# Patient Record
Sex: Female | Born: 1955 | Race: Black or African American | Hispanic: No | Smoking: Never smoker
Health system: Southern US, Community
[De-identification: ages and names within clinical notes are randomized; demographics above are authoritative.]

## PROBLEM LIST (undated history)

## (undated) DIAGNOSIS — I10 Essential (primary) hypertension: Secondary | ICD-10-CM

---

## 2005-09-14 ENCOUNTER — Emergency Department (HOSPITAL_COMMUNITY): Admission: EM | Admit: 2005-09-14 | Discharge: 2005-09-14 | Payer: Self-pay | Admitting: Emergency Medicine
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
## 2011-03-05 ENCOUNTER — Encounter: Payer: Self-pay | Admitting: *Deleted

## 2011-03-05 ENCOUNTER — Emergency Department (HOSPITAL_BASED_OUTPATIENT_CLINIC_OR_DEPARTMENT_OTHER)
Admission: EM | Admit: 2011-03-05 | Discharge: 2011-03-05 | Disposition: A | Discharge status: Home / Self Care | Payer: PRIVATE HEALTH INSURANCE | Attending: Emergency Medicine | Admitting: Emergency Medicine

## 2011-03-05 DIAGNOSIS — I10 Essential (primary) hypertension: Secondary | ICD-10-CM

## 2011-03-05 LAB — DIFFERENTIAL
Basophils Absolute: 0 10*3/uL (ref 0.0–0.1)
Basophils Relative: 0 % (ref 0–1)
Eosinophils Absolute: 0.1 10*3/uL (ref 0.0–0.7)
Eosinophils Relative: 1 % (ref 0–5)
Monocytes Absolute: 0.7 10*3/uL (ref 0.1–1.0)
Monocytes Relative: 10 % (ref 3–12)
Neutrophils Relative %: 49 % (ref 43–77)

## 2011-03-05 LAB — BASIC METABOLIC PANEL
BUN: 16 mg/dL (ref 6–23)
CO2: 28 mEq/L (ref 19–32)
Calcium: 9.7 mg/dL (ref 8.4–10.5)
Chloride: 101 mEq/L (ref 96–112)
Creatinine, Ser: 0.8 mg/dL (ref 0.50–1.10)
GFR calc non Af Amer: 81 mL/min — ABNORMAL LOW (ref >90)
Glucose, Bld: 105 mg/dL — ABNORMAL HIGH (ref 70–99)
Potassium: 4.2 mEq/L (ref 3.5–5.1)

## 2011-03-05 LAB — CBC
Platelets: 272 10*3/uL (ref 150–400)
RBC: 4.66 MIL/uL (ref 3.87–5.11)
WBC: 7.2 10*3/uL (ref 4.0–10.5)

## 2011-03-05 MED ORDER — HYDROCHLOROTHIAZIDE 25 MG PO TABS: 25.0000 mg | ORAL_TABLET | Freq: Every day | ORAL | Status: AC

## 2011-03-05 MED ORDER — HYDROCHLOROTHIAZIDE 25 MG PO TABS
25.0000 mg | ORAL_TABLET | Freq: Once | ORAL | Status: AC
  Administered 2011-03-05: 25 mg via ORAL
  Filled 2011-03-05: qty 1

## 2011-03-05 NOTE — ED Notes (Signed)
Pt concerned about her blood pressure going up and down states is about to have surgery on knee "the specialist wants to know how its running so I come out here to see"

## 2011-03-05 NOTE — ED Provider Notes (Signed)
History     CSN: [id number] Arrival date & time: 03/05/2011  7:12 PM  Chief Complaint  Patient presents with  . Hypertension    (Consider location/radiation/quality/duration/timing/severity/associated sxs/prior treatment) HPI Comments: The patient presents for evaluation of her blood pressure. She reports that her blood pressure has been running high at home for the past week. She denies diabetes but says she wants to be checked for this. She has not been formally diagnosed her started on treatment for hypertension to this point. She denies any symptoms including headache, dizziness, visual changes, chest pain, shortness of breath, palpitations, nausea or vomiting. She is asymptomatic at present with a blood pressure of 178/76 mmHg. I will obtain a CBC and a basic metabolic profile to take a look at her electrolytes and kidney function. In the meantime I have ordered 25 mg of hydrochlorothiazide for treatment of her blood pressure and will likely prescribe her the same given that she has had approximately a week of elevated blood pressures at this point. Her blood pressures not acutely worrisome and she has no symptoms with it.  Patient is a 55 y.o. female presenting with hypertension. The history is provided by the patient.  Hypertension This is a new problem. The current episode started more than 1 week ago. The problem occurs constantly. The problem has not changed since onset.Pertinent negatives include no chest pain, no abdominal pain, no headaches and no shortness of breath. The symptoms are aggravated by nothing. The symptoms are relieved by nothing. She has tried nothing for the symptoms.    History reviewed. No pertinent past medical history.  History reviewed. No pertinent past surgical history.  History reviewed. No pertinent family history.  History  Substance Use Topics  . Smoking status: Never Smoker   . Smokeless tobacco: Not on file  . Alcohol Use: No    OB History    Grav Para Term Preterm Abortions TAB SAB Ect Mult Living                  Review of Systems  Constitutional: Negative for fever, chills and appetite change.  HENT: Negative for ear pain, congestion, sore throat, rhinorrhea, drooling, trouble swallowing, neck pain, neck stiffness, voice change and postnasal drip.   Eyes: Negative for photophobia and visual disturbance.  Respiratory: Negative for cough, chest tightness, shortness of breath and wheezing.   Cardiovascular: Negative for chest pain and palpitations.  Gastrointestinal: Negative for nausea, vomiting, abdominal pain, diarrhea, constipation and abdominal distention.  Genitourinary: Negative.   Musculoskeletal: Negative for myalgias, back pain, joint swelling, arthralgias and gait problem.  Skin: Negative for color change, pallor, rash and wound.  Neurological: Negative for tremors, seizures, syncope, facial asymmetry, speech difficulty, weakness, light-headedness, numbness and headaches.    Allergies  Review of patient's allergies indicates no known allergies.  Home Medications  No current outpatient prescriptions on file.  BP 178/76  Pulse 82  Temp(Src) 98.7 F (37.1 C) (Oral)  Resp 20  SpO2 100%  Physical Exam  Nursing note and vitals reviewed. Constitutional: She is oriented to person, place, and time. She appears well-developed and well-nourished. No distress.  HENT:  Head: Normocephalic and atraumatic.  Mouth/Throat: Oropharynx is clear and moist.  Eyes: Conjunctivae and EOM are normal.  Neck: Normal range of motion.  Cardiovascular: Normal rate, regular rhythm, normal heart sounds and intact distal pulses.  Exam reveals no gallop and no friction rub.   No murmur heard. Pulmonary/Chest: Effort normal and breath sounds normal. No respiratory  distress. She has no wheezes. She has no rales. She exhibits no tenderness.  Abdominal: Soft. Bowel sounds are normal. She exhibits no distension. There is no tenderness.  There is no rebound and no guarding.  Musculoskeletal: Normal range of motion. She exhibits no edema and no tenderness.  Neurological: She is alert and oriented to person, place, and time. She has normal reflexes. No cranial nerve deficit. She exhibits normal muscle tone. Coordination normal.  Skin: Skin is warm and dry. No rash noted. She is not diaphoretic. No erythema. No pallor.  Psychiatric: She has a normal mood and affect. Her behavior is normal.    ED Course  Procedures (including critical care time)  Labs Reviewed  BASIC METABOLIC PANEL - Abnormal; Notable for the following:    Glucose, Bld 105 (*)    GFR calc non Af Amer 81 (*)    All other components within normal limits  CBC  DIFFERENTIAL   No results found.   No diagnosis found.    MDM  Essential hypertension, renovascular disease, electrolyte abnormality, diabetes mellitus        Felisa Bonier, MD 03/06/11 2139

## 2013-11-10 ENCOUNTER — Encounter (HOSPITAL_BASED_OUTPATIENT_CLINIC_OR_DEPARTMENT_OTHER): Payer: Self-pay | Admitting: Emergency Medicine

## 2013-11-10 DIAGNOSIS — Z79899 Other long term (current) drug therapy: Secondary | ICD-10-CM | POA: Insufficient documentation

## 2013-11-10 DIAGNOSIS — K029 Dental caries, unspecified: Secondary | ICD-10-CM | POA: Insufficient documentation

## 2013-11-10 DIAGNOSIS — I10 Essential (primary) hypertension: Secondary | ICD-10-CM | POA: Insufficient documentation

## 2013-11-10 NOTE — ED Notes (Signed)
Pt reports right side facial/jaw swelling denies dental pain , tongue swelling or resp distress

## 2013-11-11 ENCOUNTER — Encounter (HOSPITAL_BASED_OUTPATIENT_CLINIC_OR_DEPARTMENT_OTHER): Payer: Self-pay | Admitting: Emergency Medicine

## 2013-11-11 ENCOUNTER — Emergency Department (HOSPITAL_BASED_OUTPATIENT_CLINIC_OR_DEPARTMENT_OTHER)
Admission: EM | Admit: 2013-11-11 | Discharge: 2013-11-11 | Disposition: A | Discharge status: Home / Self Care | Payer: No Typology Code available for payment source | Attending: Emergency Medicine | Admitting: Emergency Medicine

## 2013-11-11 DIAGNOSIS — K029 Dental caries, unspecified: Secondary | ICD-10-CM

## 2013-11-11 HISTORY — DX: Essential (primary) hypertension: I10

## 2013-11-11 MED ORDER — IBUPROFEN 800 MG PO TABS
ORAL_TABLET | ORAL | Status: AC
  Filled 2013-11-11: qty 1

## 2013-11-11 MED ORDER — IBUPROFEN 800 MG PO TABS
800.0000 mg | ORAL_TABLET | Freq: Once | ORAL | Status: AC
  Administered 2013-11-11: 800 mg via ORAL

## 2013-11-11 MED ORDER — IBUPROFEN 800 MG PO TABS: 800.0000 mg | ORAL_TABLET | Freq: Three times a day (TID) | ORAL | Status: AC

## 2013-11-11 MED ORDER — PENICILLIN V POTASSIUM 250 MG PO TABS
500.0000 mg | ORAL_TABLET | Freq: Once | ORAL | Status: AC
  Administered 2013-11-11: 500 mg via ORAL

## 2013-11-11 MED ORDER — PENICILLIN V POTASSIUM 250 MG PO TABS
ORAL_TABLET | ORAL | Status: AC
  Filled 2013-11-11: qty 1

## 2013-11-11 MED ORDER — PENICILLIN V POTASSIUM 500 MG PO TABS: 500.0000 mg | ORAL_TABLET | Freq: Four times a day (QID) | ORAL | Status: AC

## 2013-11-11 NOTE — ED Provider Notes (Signed)
CSN: [id number]     Arrival date & time 11/10/13  2336 History   First MD Initiated Contact with Patient 11/11/13 0251     Chief Complaint  Patient presents with  . Facial Swelling     (Consider location/radiation/quality/duration/timing/severity/associated sxs/prior Treatment) Patient is a 58 y.o. female presenting with tooth pain. The history is provided by the patient.  Dental Pain Location:  Upper Upper teeth location:  3/RU 1st molar and 2/RU 2nd molar Quality:  No pain Severity:  No pain Onset quality:  Gradual Timing:  Constant Progression:  Unchanged Chronicity:  New Context: poor dentition   Context: not abscess   Previous work-up:  Dental exam Relieved by:  Nothing Worsened by:  Nothing tried Ineffective treatments:  None tried Associated symptoms: facial swelling   Associated symptoms: no congestion, no difficulty swallowing and no fever   Associated symptoms comment:  Mild cheek swelling Risk factors: no smoking     Past Medical History  Diagnosis Date  . Hypertension    History reviewed. No pertinent past surgical history. History reviewed. No pertinent family history. History  Substance Use Topics  . Smoking status: Never Smoker   . Smokeless tobacco: Not on file  . Alcohol Use: No   OB History   Grav Para Term Preterm Abortions TAB SAB Ect Mult Living                 Review of Systems  Constitutional: Negative for fever.  HENT: Positive for facial swelling. Negative for congestion.   All other systems reviewed and are negative.     Allergies  Review of patient's allergies indicates no known allergies.  Home Medications   Prior to Admission medications   Medication Sig Start Date End Date Taking? Authorizing Provider  hydrochlorothiazide (HYDRODIURIL) 25 MG tablet Take 1 tablet (25 mg total) by mouth daily. 03/05/11 03/04/12  Felisa BonierMichael D Connor, MD   BP 180/40  Pulse 91  Temp(Src) 98.2 F (36.8 C) (Oral)  Resp 16  Ht 5\' 2"  (1.575 m)   Wt 219 lb (99.338 kg)  BMI 40.05 kg/m2  SpO2 100% Physical Exam  Constitutional: She is oriented to person, place, and time. She appears well-developed and well-nourished.  HENT:  Head: Normocephalic and atraumatic.  Mouth/Throat: Oropharynx is clear and moist. No trismus in the jaw. Dental caries present. No uvula swelling.    Eyes: Conjunctivae are normal. Pupils are equal, round, and reactive to light.  Neck: Normal range of motion. Neck supple. No tracheal deviation present.  Cardiovascular: Normal rate, regular rhythm and intact distal pulses.   Pulmonary/Chest: Effort normal and breath sounds normal. No stridor. She has no wheezes. She has no rales.  Abdominal: Soft. Bowel sounds are normal. There is no tenderness. There is no rebound and no guarding.  Musculoskeletal: Normal range of motion.  Lymphadenopathy:    She has no cervical adenopathy.  Neurological: She is alert and oriented to person, place, and time.  Skin: Skin is warm and dry.  Psychiatric: She has a normal mood and affect.    ED Course  Procedures (including critical care time) Labs Review Labs Reviewed - No data to display  Imaging Review No results found.   EKG Interpretation None      MDM   Final diagnoses:  None    Dental caries take all antibiotics and follow up Monday with dentist.  Return for fevers worsening swelling rashes on the skin shortness of breath or any concerns    Keelyn Monjaras  Smitty CordsK Briget Shaheed-Rasch, MD 11/11/13 [id number]

## 2013-11-11 NOTE — Discharge Instructions (Signed)
Dental Caries °Dental caries is tooth decay. This decay can cause a hole in teeth (cavity) that can get bigger and deeper over time. °HOME CARE °· Brush and floss your teeth. Do this at least two times a day. °· Use a fluoride toothpaste. °· Use a mouth rinse if told by your dentist or doctor. °· Eat less sugary and starchy foods. Drink less sugary drinks. °· Avoid snacking often on sugary and starchy foods. Avoid sipping often on sugary drinks. °· Keep regular checkups and cleanings with your dentist. °· Use fluoride supplements if told by your dentist or doctor. °· Allow fluoride to be applied to teeth if told by your dentist or doctor. °MAKE SURE YOU: °· Understand these instructions. °· Will watch your condition. °· Will get help right away if you are not doing well or get worse. °Document Released: 02/25/2008 Document Revised: 01/18/2013 Document Reviewed: 05/20/2012 °[employer]® Patient Information ©2014 [employer]. ° °
